# Patient Record
Sex: Male | Born: 2000 | Race: White | Hispanic: No | Marital: Single | Smoking: Never smoker
Health system: Southern US, Community
[De-identification: ages and names within clinical notes are randomized; demographics above are authoritative.]

## PROBLEM LIST (undated history)

## (undated) ENCOUNTER — Emergency Department (HOSPITAL_COMMUNITY): Admission: EM | Payer: Self-pay | Source: Home / Self Care

## (undated) HISTORY — PX: OTHER SURGICAL HISTORY: SHX169

---
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
## 2024-01-26 ENCOUNTER — Encounter (HOSPITAL_COMMUNITY): Payer: Self-pay

## 2024-01-26 ENCOUNTER — Other Ambulatory Visit: Payer: Self-pay

## 2024-01-26 ENCOUNTER — Emergency Department (HOSPITAL_COMMUNITY)
Admission: EM | Admit: 2024-01-26 | Discharge: 2024-01-26 | Disposition: A | Discharge status: Home / Self Care | Payer: Self-pay | Attending: Emergency Medicine | Admitting: Emergency Medicine

## 2024-01-26 DIAGNOSIS — K029 Dental caries, unspecified: Secondary | ICD-10-CM | POA: Insufficient documentation

## 2024-01-26 DIAGNOSIS — K0889 Other specified disorders of teeth and supporting structures: Secondary | ICD-10-CM

## 2024-01-26 NOTE — ED Provider Notes (Signed)
 " [place] EMERGENCY DEPARTMENT AT [facility] Provider Note   CSN: [id number] Arrival date & time: 01/26/24  1358     Patient presents with: Dental Pain   Tom Winters is a 24 y.o. male.    Dental Pain       Tom Winters is a 24 y.o. male who presents to the Emergency Department requesting a work note.  He describes right upper dental pain for 1 week.  He was seen by a dentist and given antibiotics.  He has been taking medication for several days.  He notes that he still has some pain of his mouth and swelling to his right face.  He is here requesting a note for work stating that he does not feel like going into work today due to the pain of his mouth and face.  He denies any difficulty swallowing, breathing, neck pain, or fever.   Prior to Admission medications  Medication Sig Start Date End Date Taking? Authorizing Provider  azithromycin  (ZITHROMAX ) 200 MG/5ML suspension Take 12 mL (490 mg) for 1 day and then take 6 mL (245 mg) for days 2 through 5. 09/18/17   Law, Alexandra M, PA-C  ciprofloxacin  (CIPRO ) 250 MG tablet Take 1.5 tablets (375 mg total) by mouth every 12 (twelve) hours. 11/08/11   Jamelle Lorrayne HERO, NP  loratadine  (CLARITIN  REDITABS) 10 MG dissolvable tablet Take 1 tablet (10 mg total) by mouth daily. 06/25/13   Jamelle Lorrayne HERO, NP  naproxen  (NAPROSYN ) 500 MG tablet Take 1 tablet (500 mg total) by mouth 2 (two) times daily with a meal. 11/06/21   Cleotilde Rogue, MD    Allergies: Penicillins    Review of Systems  All other systems reviewed and are negative.   Updated Vital Signs BP 108/81   Pulse 67   Temp 98.2 F (36.8 C) (Oral)   Resp 18   Ht 5' 6 (1.676 m)   Wt 59 kg   SpO2 100%   BMI 20.98 kg/m   Physical Exam Vitals and nursing note reviewed.  Constitutional:      General: He is not in acute distress.    Appearance: Normal appearance. He is not ill-appearing or toxic-appearing.  HENT:     Mouth/Throat:     Mouth: Mucous membranes are  moist.     Dentition: Dental tenderness and dental caries present.     Pharynx: Oropharynx is clear. Uvula midline. No uvula swelling.     Comments: Overall poor dentition with dental caries of the right upper incisors premolars.  Mild swelling at the right nasolabial fold.  No erythema no periorbital edema or erythema.  Uvula midline nonedematous.  No trismus Cardiovascular:     Rate and Rhythm: Normal rate and regular rhythm.     Pulses: Normal pulses.  Pulmonary:     Effort: Pulmonary effort is normal.  Musculoskeletal:     Cervical back: Normal range of motion. No tenderness.  Neurological:     Mental Status: He is alert.     (all labs ordered are listed, but only abnormal results are displayed) Labs Reviewed - No data to display  EKG: None  Radiology: No results found.   Procedures   Medications Ordered in the ED - No data to display                                  Medical Decision Making  Patient here requesting work note.  Recently seen by dentist for dental pain and facial swelling.  He is currently taking antibiotics.  He denies any other symptoms.  He is clinically well-appearing on my exam.  Nontoxic.  Vital signs are reassuring.  Exam without concern for Ludewig's angina.  Patient encouraged to continue his antibiotics and follow-up closely outpatient with his dentist.  Will provide work note        Final diagnoses:  Pain, dental    ED Discharge Orders     None          Herlinda Milling, PA-C 01/26/24 1426  "

## 2024-01-26 NOTE — ED Triage Notes (Signed)
 Pt from home complains of dental pain started 1 weeks ago, already seen at dentist and given antibiotics, states he needs a notes for work. Pt aaox4, ambulatory.

## 2024-01-26 NOTE — Discharge Instructions (Signed)
 Continue to take your antibiotics and follow-up closely with your dentist.
